# Patient Record
Sex: Female | Born: 1935 | Race: White | Hispanic: No | Marital: Married | Smoking: Never smoker
Health system: Southern US, Community
[De-identification: ages and names within clinical notes are randomized; demographics above are authoritative.]

## PROBLEM LIST (undated history)

## (undated) HISTORY — PX: ABDOMINAL HYSTERECTOMY: SHX81

---

## 2003-02-27 ENCOUNTER — Other Ambulatory Visit: Payer: Self-pay

## 2004-10-11 ENCOUNTER — Ambulatory Visit: Payer: Self-pay | Admitting: Internal Medicine

## 2005-09-29 ENCOUNTER — Ambulatory Visit: Payer: Self-pay | Admitting: Gastroenterology

## 2005-10-20 ENCOUNTER — Ambulatory Visit: Payer: Self-pay | Admitting: Internal Medicine

## 2007-01-16 ENCOUNTER — Ambulatory Visit: Payer: Self-pay | Admitting: Family Medicine

## 2008-01-21 ENCOUNTER — Ambulatory Visit: Payer: Self-pay | Admitting: Internal Medicine

## 2009-01-22 ENCOUNTER — Ambulatory Visit: Payer: Self-pay | Admitting: Internal Medicine

## 2009-02-09 ENCOUNTER — Ambulatory Visit: Payer: Self-pay | Admitting: Internal Medicine

## 2009-03-03 ENCOUNTER — Ambulatory Visit: Payer: Self-pay | Admitting: Surgery

## 2009-03-03 HISTORY — PX: BREAST BIOPSY: SHX20

## 2009-09-10 ENCOUNTER — Ambulatory Visit: Payer: Self-pay | Admitting: Surgery

## 2010-01-25 ENCOUNTER — Ambulatory Visit: Payer: Self-pay | Admitting: Internal Medicine

## 2011-01-28 ENCOUNTER — Ambulatory Visit: Payer: Self-pay | Admitting: Internal Medicine

## 2011-09-27 ENCOUNTER — Ambulatory Visit: Payer: Self-pay | Admitting: Gastroenterology

## 2012-02-01 ENCOUNTER — Ambulatory Visit: Payer: Self-pay | Admitting: Internal Medicine

## 2013-02-05 ENCOUNTER — Ambulatory Visit: Payer: Self-pay | Admitting: Internal Medicine

## 2013-07-11 IMAGING — MG MM DIGITAL SCREENING BILAT W/ CAD
1 series · 4 of 4 positions shown · non-contrast
Comparison: none

REASON FOR EXAM: SCR MAMMO NO ORDER
COMMENTS:

PROCEDURE:     MMM - MMM DGT SCR NO ORDER W/CAD  - February 01, 2012 [DATE]
RESULT:
Comparisons: 01/28/2011, 01/25/2010, 01/22/2009, and 01/21/2008.

[R CC · right · 4 of 4 slices shown]
[im 1/4]
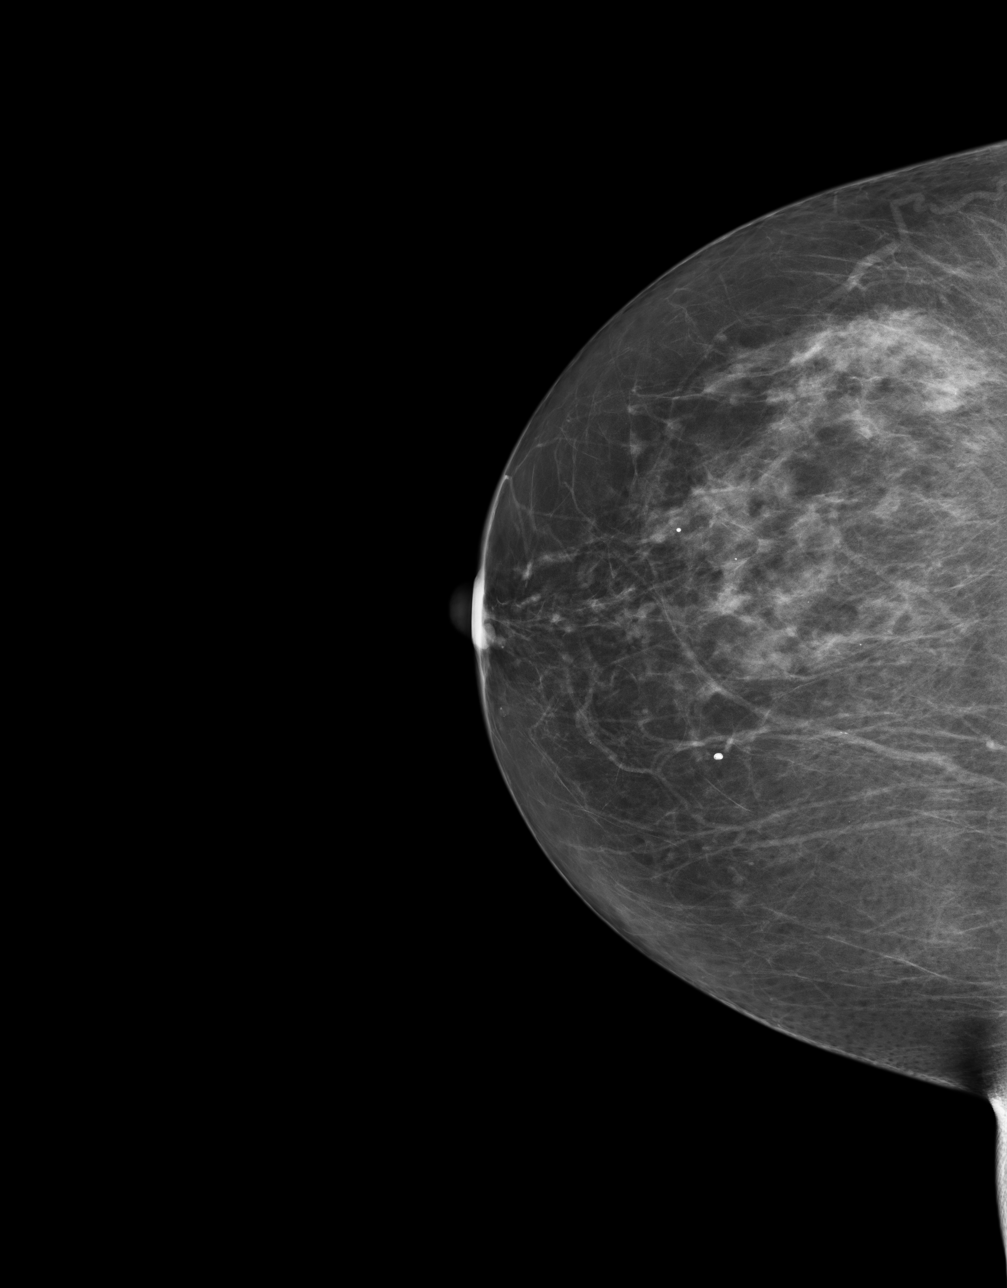
[im 2/4]
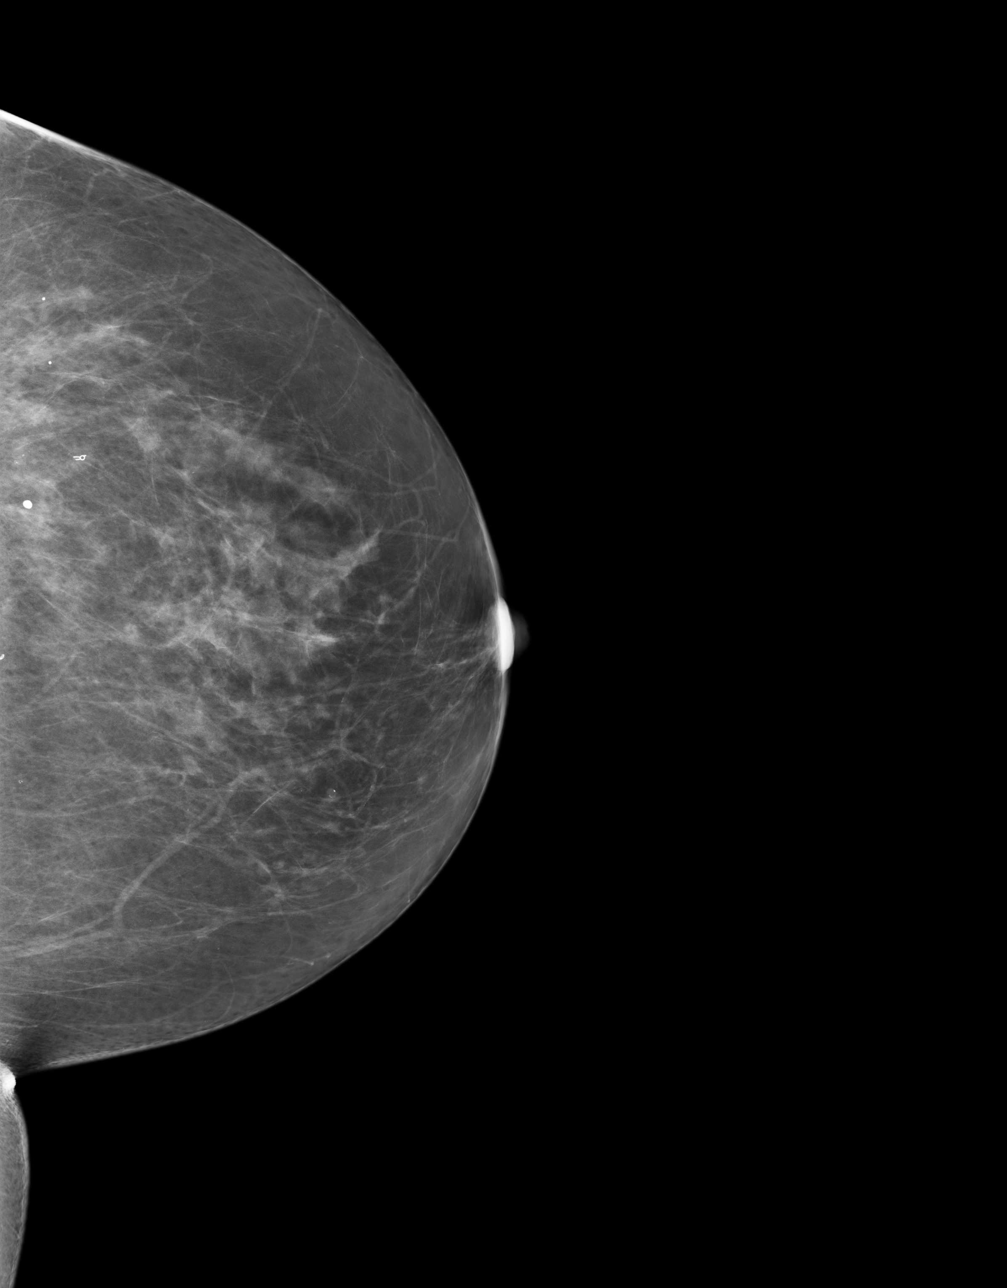
[im 3/4]
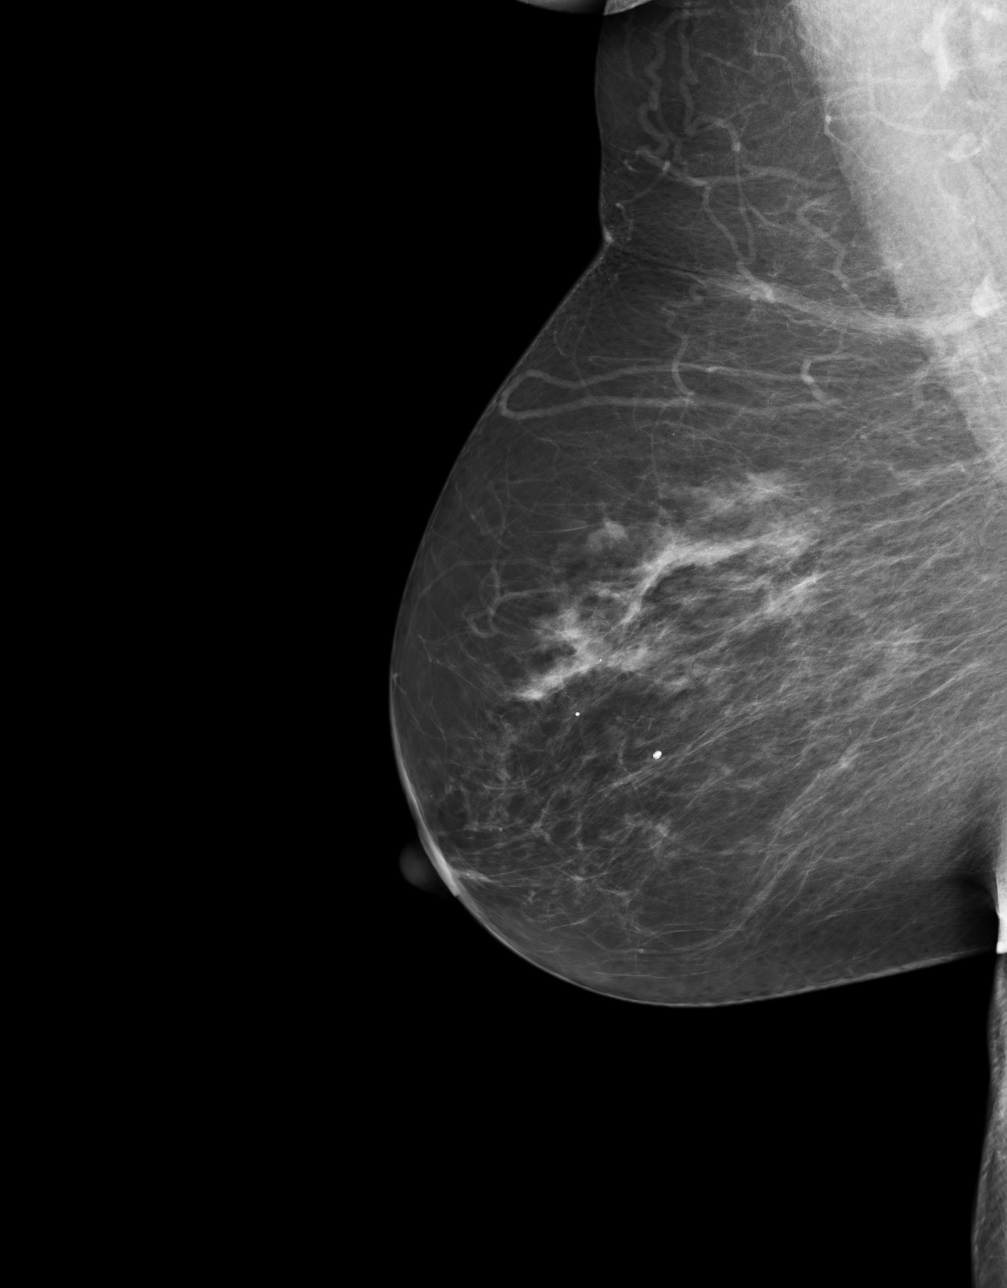
[im 4/4]
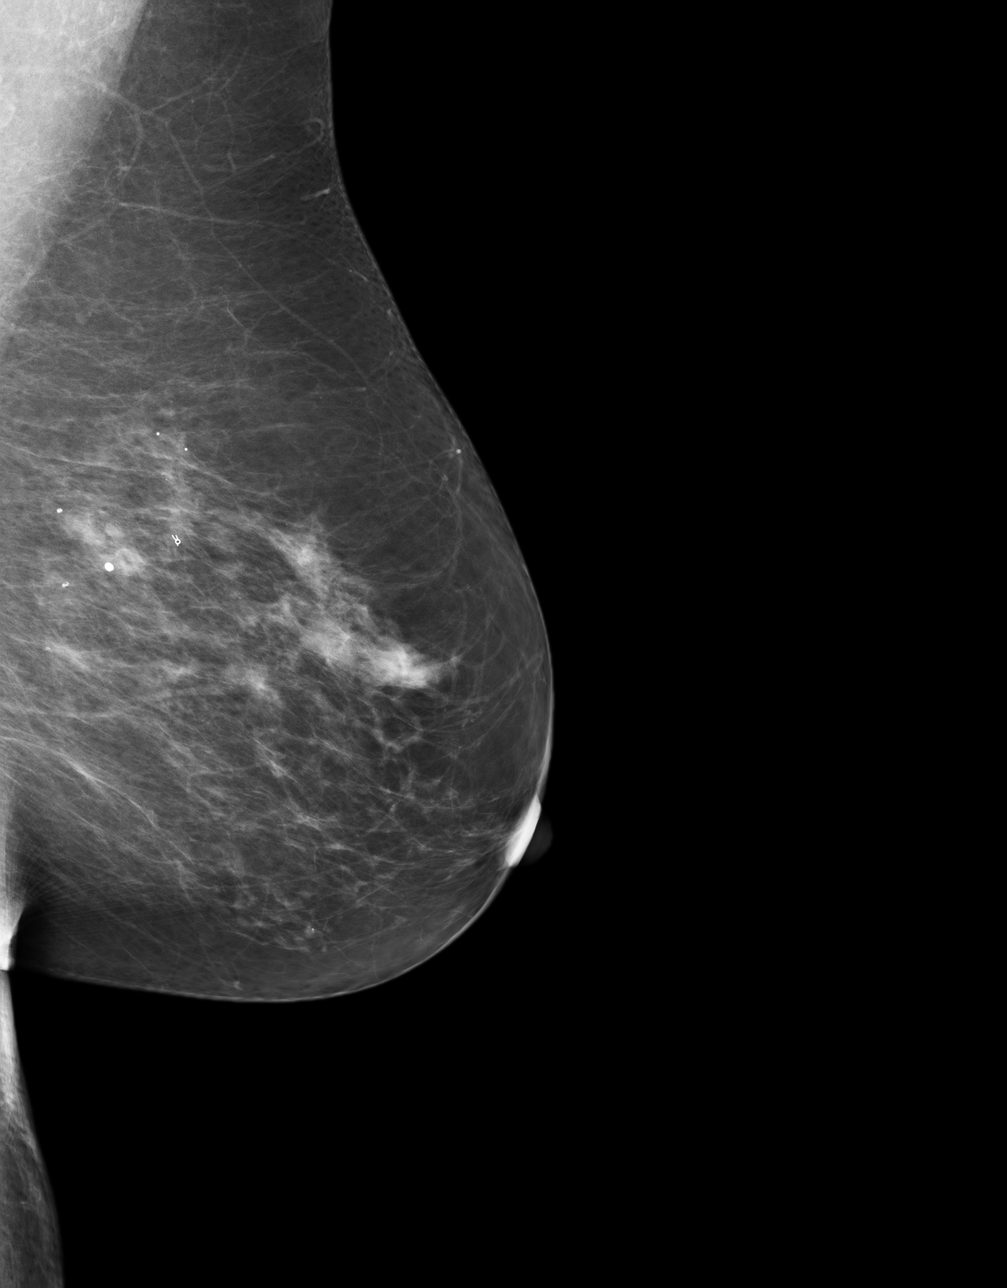

[4 of 4 positions shown; findings below may reference images not displayed]

FINDINGS: There is scattered fibroglandular tissue. No suspicious masses or
calcifications are identified. No areas of architectural distortion.
IMPRESSION: BI-RADS: Category 1 - Negative

Recommend continued annual screening mammography.

A NEGATIVE MAMMOGRAM REPORT DOES NOT PRECLUDE BIOPSY OR OTHER EVALUATION OF
A CLINICALLY PALPABLE OR OTHERWISE SUSPICIOUS MASS OR LESION. BREAST CANCER
MAY NOT BE DETECTED BY MAMMOGRAPHY IN UP TO 10% OF CASES.

## 2015-01-21 ENCOUNTER — Other Ambulatory Visit: Payer: Self-pay | Admitting: Internal Medicine

## 2015-01-21 DIAGNOSIS — Z1231 Encounter for screening mammogram for malignant neoplasm of breast: Secondary | ICD-10-CM

## 2015-01-22 ENCOUNTER — Ambulatory Visit
Admission: RE | Admit: 2015-01-22 | Discharge: 2015-01-22 | Disposition: A | Discharge status: Home / Self Care | Payer: Medicare Other | Source: Ambulatory Visit | Attending: Internal Medicine | Admitting: Internal Medicine

## 2015-01-22 DIAGNOSIS — Z1231 Encounter for screening mammogram for malignant neoplasm of breast: Secondary | ICD-10-CM | POA: Insufficient documentation

## 2017-01-24 ENCOUNTER — Other Ambulatory Visit: Payer: Self-pay | Admitting: Internal Medicine

## 2017-01-24 DIAGNOSIS — Z1231 Encounter for screening mammogram for malignant neoplasm of breast: Secondary | ICD-10-CM

## 2017-02-14 ENCOUNTER — Ambulatory Visit
Admission: RE | Admit: 2017-02-14 | Discharge: 2017-02-14 | Disposition: A | Discharge status: Home / Self Care | Payer: Medicare Other | Source: Ambulatory Visit | Attending: Internal Medicine | Admitting: Internal Medicine

## 2017-02-14 DIAGNOSIS — Z1231 Encounter for screening mammogram for malignant neoplasm of breast: Secondary | ICD-10-CM | POA: Diagnosis not present

## 2019-05-10 ENCOUNTER — Ambulatory Visit: Payer: Medicare Other | Attending: Internal Medicine

## 2019-05-10 DIAGNOSIS — Z23 Encounter for immunization: Secondary | ICD-10-CM | POA: Insufficient documentation

## 2019-05-10 NOTE — Progress Notes (Signed)
   Covid-19 Vaccination Clinic  Name:  Heidi Singh    MRN: [id number] DOB: [birth year]  05/10/2019  [name] was observed post Covid-19 immunization for 15 minutes without incidence. She was provided with Vaccine Information Sheet and instruction to access the V-Safe system.   [name] was instructed to call 911 with any severe reactions post vaccine: Marland Kitchen Difficulty breathing  . Swelling of your face and throat  . A fast heartbeat  . A bad rash all over your body  . Dizziness and weakness    Immunizations Administered    Name Date Dose VIS Date Route   Pfizer COVID-19 Vaccine 05/10/2019 11:06 AM 0.3 mL 02/22/2019 Intramuscular   Manufacturer: [place]   Lot: [id number]   [place]: [id number]

## 2019-06-04 ENCOUNTER — Ambulatory Visit: Payer: Medicare Other | Attending: Internal Medicine

## 2019-06-04 DIAGNOSIS — Z23 Encounter for immunization: Secondary | ICD-10-CM

## 2019-06-04 NOTE — Progress Notes (Signed)
   Covid-19 Vaccination Clinic  Name:  Heidi Singh    MRN: [id number] DOB: 08/17/1935  06/04/2019  [name] was observed post Covid-19 immunization for 15 minutes without incident. She was provided with Vaccine Information Sheet and instruction to access the V-Safe system.   Ms. Will was instructed to call 911 with any severe reactions post vaccine: Marland Kitchen Difficulty breathing  . Swelling of face and throat  . A fast heartbeat  . A bad rash all over body  . Dizziness and weakness   Immunizations Administered    Name Date Dose VIS Date Route   Pfizer COVID-19 Vaccine 06/04/2019  3:33 PM 0.3 mL 02/22/2019 Intramuscular   Manufacturer: [place]   Lot: [id number]   [place]: [id number]

## 2020-08-12 ENCOUNTER — Other Ambulatory Visit: Payer: Self-pay | Admitting: Nephrology

## 2020-08-12 DIAGNOSIS — N184 Chronic kidney disease, stage 4 (severe): Secondary | ICD-10-CM

## 2020-08-12 DIAGNOSIS — D631 Anemia in chronic kidney disease: Secondary | ICD-10-CM

## 2020-08-12 DIAGNOSIS — E1122 Type 2 diabetes mellitus with diabetic chronic kidney disease: Secondary | ICD-10-CM

## 2020-08-12 DIAGNOSIS — R809 Proteinuria, unspecified: Secondary | ICD-10-CM

## 2020-08-12 DIAGNOSIS — N189 Chronic kidney disease, unspecified: Secondary | ICD-10-CM

## 2020-08-12 DIAGNOSIS — R8290 Unspecified abnormal findings in urine: Secondary | ICD-10-CM

## 2020-09-02 ENCOUNTER — Ambulatory Visit
Admission: RE | Admit: 2020-09-02 | Discharge: 2020-09-02 | Disposition: A | Discharge status: Home / Self Care | Payer: Medicare Other | Source: Ambulatory Visit | Attending: Nephrology | Admitting: Nephrology

## 2020-09-02 ENCOUNTER — Other Ambulatory Visit: Payer: Self-pay

## 2020-09-02 DIAGNOSIS — D631 Anemia in chronic kidney disease: Secondary | ICD-10-CM | POA: Diagnosis present

## 2020-09-02 DIAGNOSIS — E1122 Type 2 diabetes mellitus with diabetic chronic kidney disease: Secondary | ICD-10-CM | POA: Diagnosis present

## 2020-09-02 DIAGNOSIS — R809 Proteinuria, unspecified: Secondary | ICD-10-CM

## 2020-09-02 DIAGNOSIS — N184 Chronic kidney disease, stage 4 (severe): Secondary | ICD-10-CM

## 2020-09-02 DIAGNOSIS — N189 Chronic kidney disease, unspecified: Secondary | ICD-10-CM | POA: Diagnosis present

## 2020-09-02 DIAGNOSIS — R8290 Unspecified abnormal findings in urine: Secondary | ICD-10-CM

## 2021-11-20 ENCOUNTER — Ambulatory Visit
Admission: EM | Admit: 2021-11-20 | Discharge: 2021-11-20 | Disposition: A | Discharge status: Home / Self Care | Payer: Medicare Other | Attending: Family Medicine | Admitting: Family Medicine

## 2021-11-20 DIAGNOSIS — B084 Enteroviral vesicular stomatitis with exanthem: Secondary | ICD-10-CM

## 2021-11-20 MED ORDER — HYDROXYZINE HCL 10 MG PO TABS: 25.0000 mg | ORAL_TABLET | Freq: Four times a day (QID) | ORAL | 0 refills | Status: AC

## 2021-11-20 NOTE — ED Triage Notes (Addendum)
Patient reports that her right eye is swollen, red and started breaking out on the right side of her neck and face. Patient reports she is having a reaction to a medication that she was given Thursday by PCP  patient is unsure of the medication that is causing the reaction.   She is currently taking -  - Amoxicillin Clav 875-125mg  - dexamethasone 4mg   - neom/plodex 0.1%

## 2021-11-20 NOTE — ED Provider Notes (Signed)
[facility]    CSN: [id number] Arrival date & time: 11/20/21  8250      History   Chief Complaint No chief complaint on file.   HPI Heidi Singh is a 86 y.o. female.   HPI  Heidi Singh presents for ration worsening right ear pain.  Was seen by her primary care provider yesterday and given medications for a possible infection.  She continues to have left eye drainage and swelling.  She has this itchy rash that showed up.  She notes that it is spreading.  She has not had a fever.  Endorses sore throat and nasal congestion on the right side.  She has some right-sided sinus pressure as well.  There has been no nausea, vomiting, diarrhea, vision changes, chest pain, shortness of breath, dysuria and headache.   History reviewed. No pertinent past medical history.  There are no problems to display for this patient.   Past Surgical History:  Procedure Laterality Date   ABDOMINAL HYSTERECTOMY     BREAST BIOPSY Left 03/03/09   bx/clip-neg    OB History   No obstetric history on file.      Home Medications    Prior to Admission medications   Medication Sig Start Date End Date Taking? Authorizing Provider  hydrochlorothiazide (MICROZIDE) 12.5 MG capsule Take by mouth. 07/07/20  Yes [provider]  hydrOXYzine (ATARAX) 10 MG tablet Take 2.5 tablets (25 mg total) by mouth every 6 (six) hours for 3 days. 11/20/21 11/23/21 Yes Ranie Chinchilla, DO  metoprolol succinate (TOPROL-XL) 50 MG 24 hr tablet Take 1 tablet by mouth daily. 10/14/21  Yes [provider]  ramipril (ALTACE) 10 MG capsule Take by mouth. 07/07/20  Yes [provider]  amoxicillin-clavulanate (AUGMENTIN) 875-125 MG tablet SMARTSIG:1 Tablet(s) By Mouth Every 12 Hours 11/18/21   [provider]  aspirin EC 81 MG tablet Take 81 mg by mouth once daily.    [provider]  dexamethasone (DECADRON) 4 MG tablet Take by mouth. 11/18/21   [provider]  neomycin-polymyxin  b-dexamethasone (MAXITROL) 3.5-10000-0.1 SUSP Place into the right eye. 11/18/21   [provider]  simvastatin (ZOCOR) 20 MG tablet Take 20 mg by mouth at bedtime. 11/05/21   [provider]    Family History Family History  Problem Relation Age of Onset   Breast cancer Neg Hx     Social History Social History   Tobacco Use   Smoking status: Never   Smokeless tobacco: Never  Vaping Use   Vaping Use: Never used  Substance Use Topics   Alcohol use: Not Currently   Drug use: Never     Allergies   Patient has no known allergies.   Review of Systems Review of Systems : negative unless otherwise stated in HPI.      Physical Exam Triage Vital Signs ED Triage Vitals  Enc Vitals Group     BP 11/20/21 0916 139/66     Pulse Rate 11/20/21 0916 65     Resp --      Temp 11/20/21 0916 98.3 F (36.8 C)     Temp Source 11/20/21 0916 Oral     SpO2 11/20/21 0916 97 %     Weight 11/20/21 0911 173 lb (78.5 kg)     Height 11/20/21 0911 5\' 4"  (1.626 m)     Head Circumference --      Peak Flow --      Pain Score 11/20/21 0911 10  Pain Loc --      Pain Edu? --      Excl. in [place]? --    No data found.  Updated Vital Signs BP 139/66 (BP Location: Left Arm)   Pulse 65   Temp 98.3 F (36.8 C) (Oral)   Ht 5\' 4"  (1.626 m)   Wt 78.5 kg   SpO2 97%   BMI 29.70 kg/m   Visual Acuity Right Eye Distance:   Left Eye Distance:   Bilateral Distance:    Right Eye Near:   Left Eye Near:    Bilateral Near:     Physical Exam  GEN: alert, well appearing female, in no acute distress    HENT:  mucus membranes moist, oropharyngeal hard palate ulcerations, right-sided swelling noted, erythematous oropharynx,  nares patent, right TM normal, left TM normal, right maxillary sinus tenderness to palpation EYES:   Wearing glasses, pupils equal and reactive, EOM intact and without pain, bilateral purulent discharge in the canthus NECK:  supple, full normal ROM RESP:  clear to  auscultation bilaterally, no increased work of breathing  CVS:   regular rate and rhythm EXT:   ROM at baseline NEURO:  speech normal, alert and oriented at baseline Skin:   warm and dry, vesicular rash on bilateral hands and feet.  She has a similar rash on her buttocks, posterior neck, left facial maculopapular rash   UC Treatments / Results  Labs (all labs ordered are listed, but only abnormal results are displayed) Labs Reviewed - No data to display  EKG   Radiology No results found.  Procedures Procedures (including critical care time)  Medications Ordered in UC Medications - No data to display  Initial Impression / Assessment and Plan / UC Course  I have reviewed the triage vital signs and the nursing notes.  Pertinent labs & imaging results that were available during my care of the patient were reviewed by me and considered in my medical decision making (see chart for details).    Heidi Singh is a 86 y.o. female who presents for worsening ear pain and rash.  Overall patient is well-appearing, well-hydrated and without respiratory distress.  She is afebrile here without recent use of antipyretics.  Vital signs are stable.  On chart review, she was seen at the [facility] clinic yesterday and was discharged with dexamethasone, antibiotic eyedrops and Augmentin.  Patient has taken Augmentin before and this is only 1 day of rash therefore I doubt this is drug related rash.  She has ulcerative oral lesions, vesicular lesions on her hands and feet.  There are also lesions on her upper back buttock and legs.  Exam is concerning for hand-foot-and-mouth disease.  Her rash and symptoms do not appear to be SJS.  She has some trouble sleeping at night due to the itching.  We discussed hydroxyzine.  Advised her of the potential side effects and she would like this medication. Gave her a short-term supply of hydroxyzine.  Use Tylenol as needed for pain.  She does have purulent drainage from  bilateral eyes.  He may have concomitant bacterial sinusitis.  Advised patient to continue taking her antibiotics and steroids for now.  She is to follow-up with her PCP early next week.  Extensively discussed reasons to stop medications and need for acute intervention.  Husband and patient voiced understanding.   Discussed MDM, treatment plan and plan for follow-up with patient/parent who agrees with plan.     Final Clinical Impressions(s) / UC Diagnoses  Final diagnoses:  Hand, foot and mouth disease (HFMD)     Discharge Instructions      Continue taking your medications as previously prescribed until your follow up with your primary care provider early next week. Your rash appears to be hand, foot and mouth disease that is caused by a virus.   See handout for more information       ED Prescriptions     Medication Sig Dispense Auth. Provider   hydrOXYzine (ATARAX) 10 MG tablet Take 2.5 tablets (25 mg total) by mouth every 6 (six) hours for 3 days. 30 tablet Lyndee Hensen, DO      PDMP not reviewed this encounter.   Lyndee Hensen, DO 11/20/21 1030

## 2021-11-20 NOTE — Discharge Instructions (Addendum)
Continue taking your medications as previously prescribed until your follow up with your primary care provider early next week. Your rash appears to be hand, foot and mouth disease that is caused by a virus.   See handout for more information

## 2022-02-10 IMAGING — US US RENAL
1 series · 14 of 25 positions shown · non-contrast
Comparison: None.

CLINICAL DATA: Chronic kidney disease stage 4.

EXAM:
RENAL / URINARY TRACT ULTRASOUND COMPLETE

[Series 1: us renal · 14 of 45 slices shown]
[im 1/45]
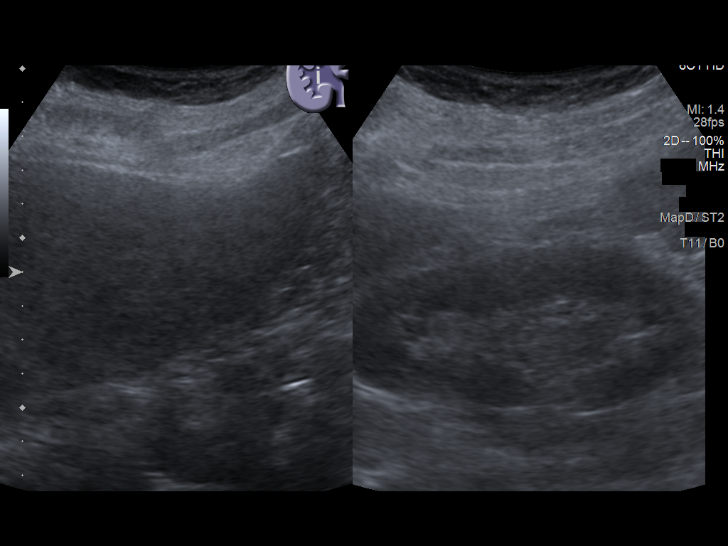
[im 4/45]
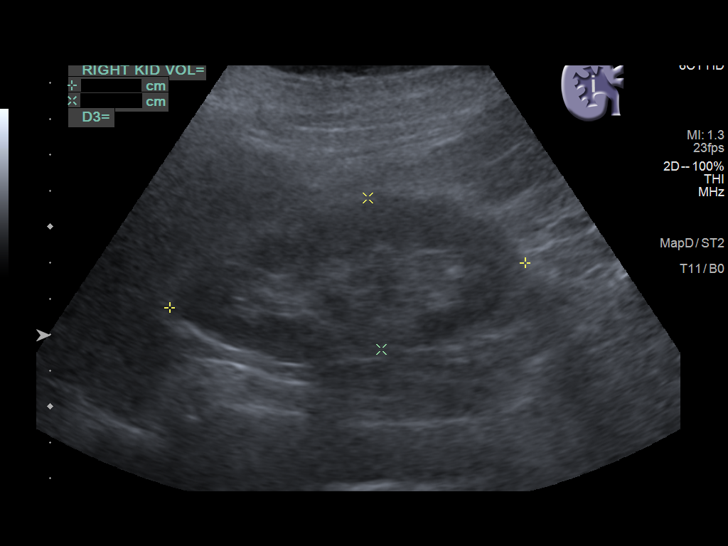
[im 8/45]
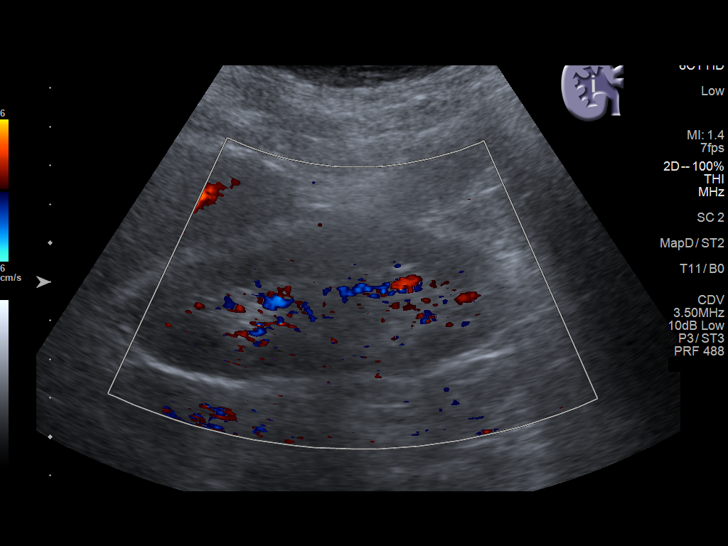
[im 12/45]
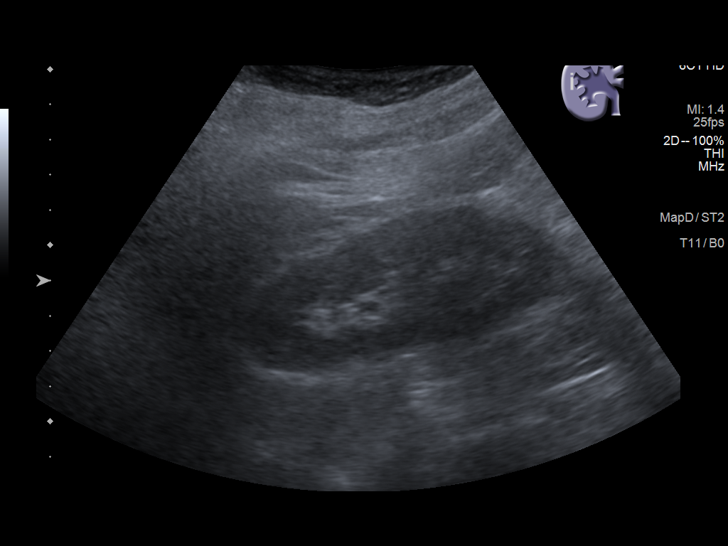
[im 15/45]
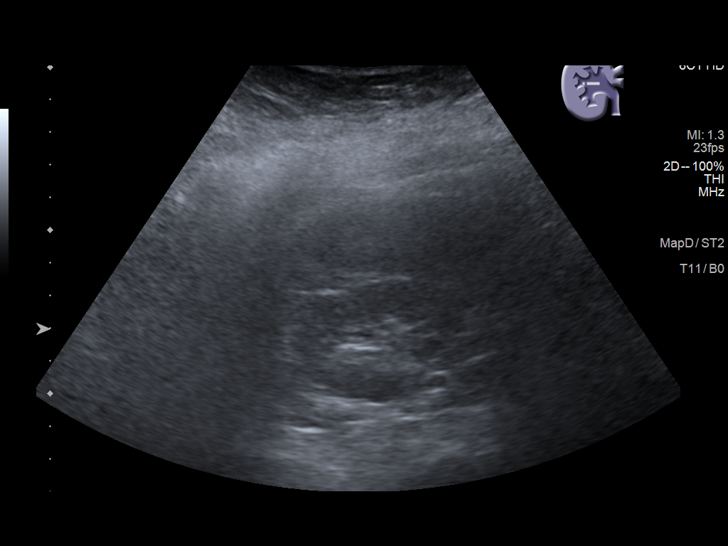
[im 17/45]
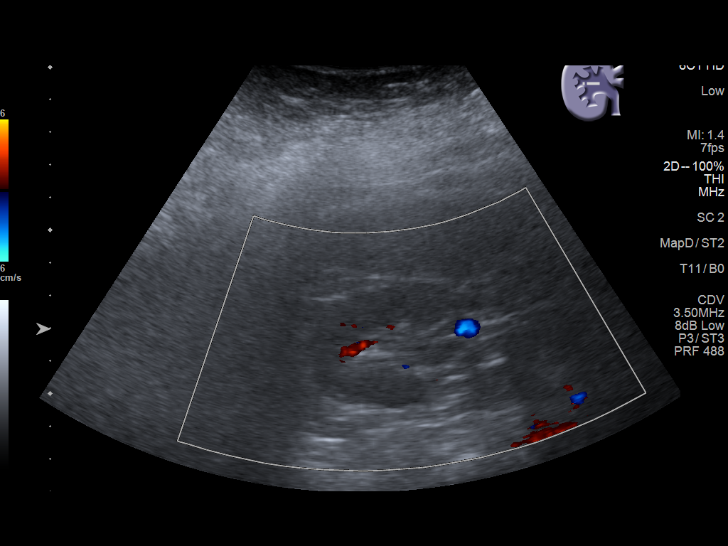
[im 21/45]
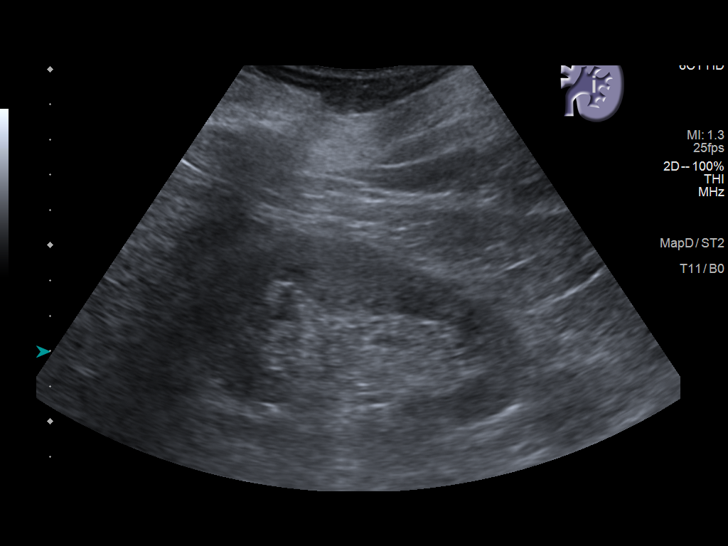
[im 24/45]
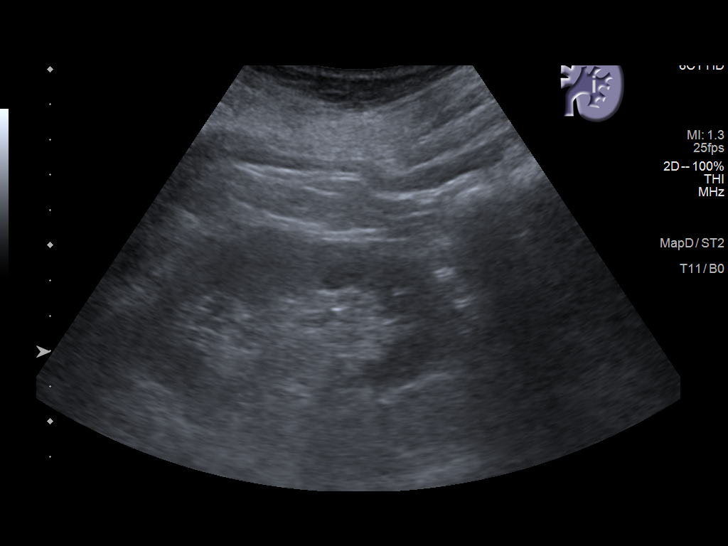
[im 28/45]
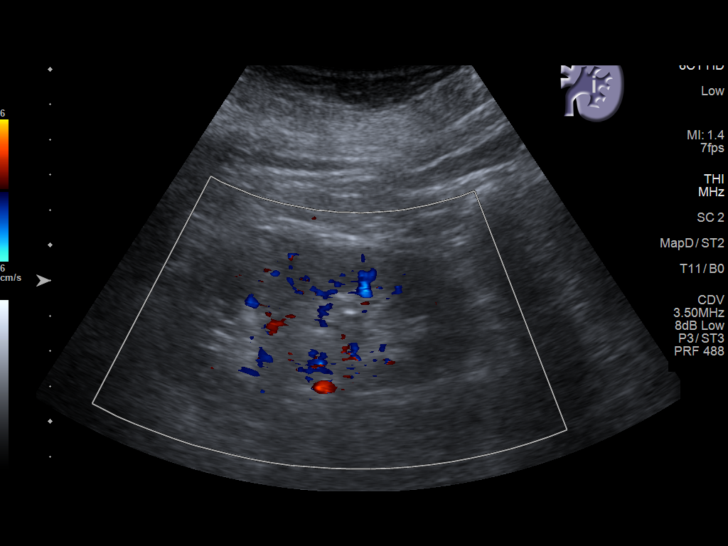
[im 30/45]
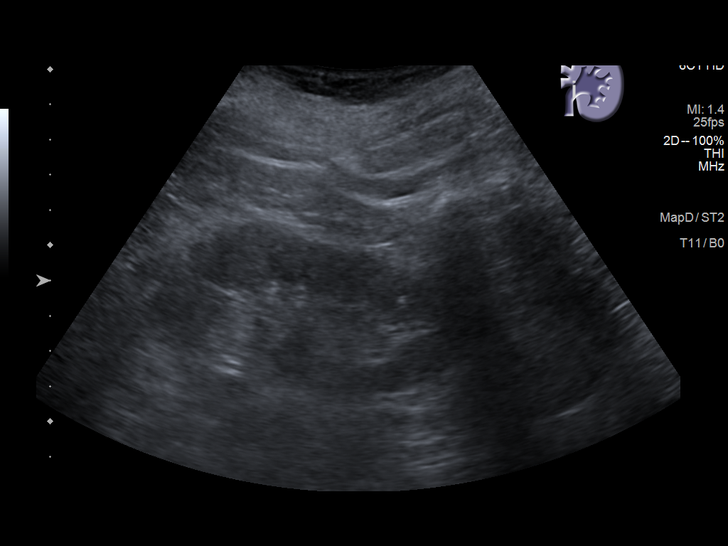
[im 34/45]
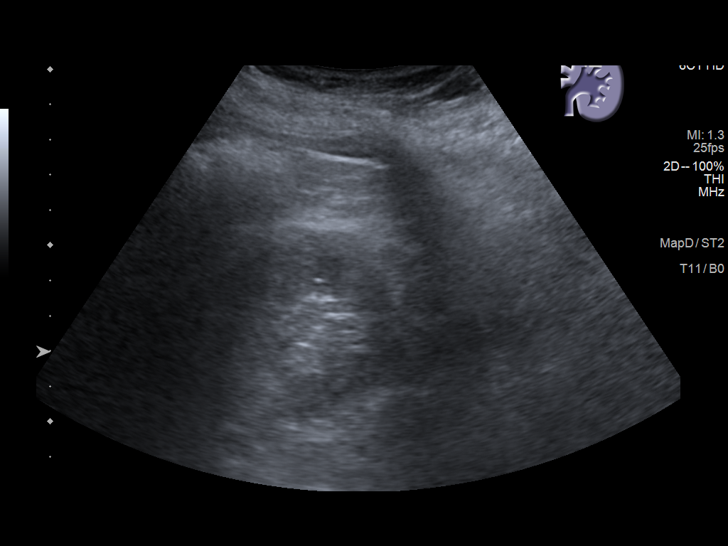
[im 37/45]
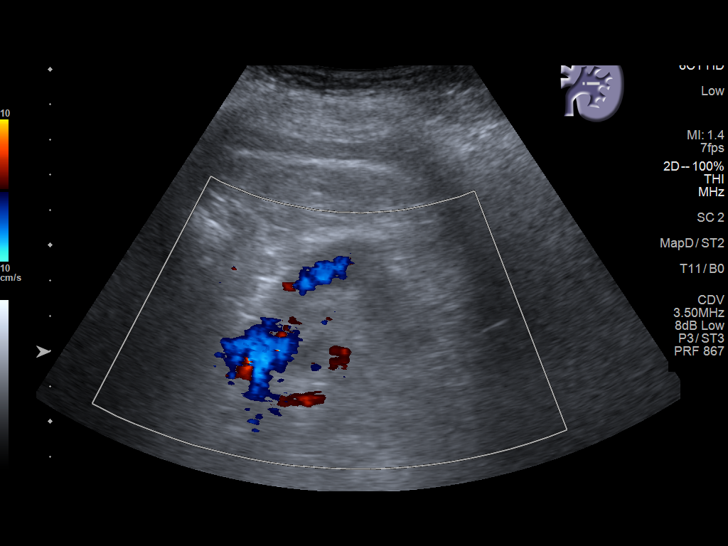
[im 41/45]
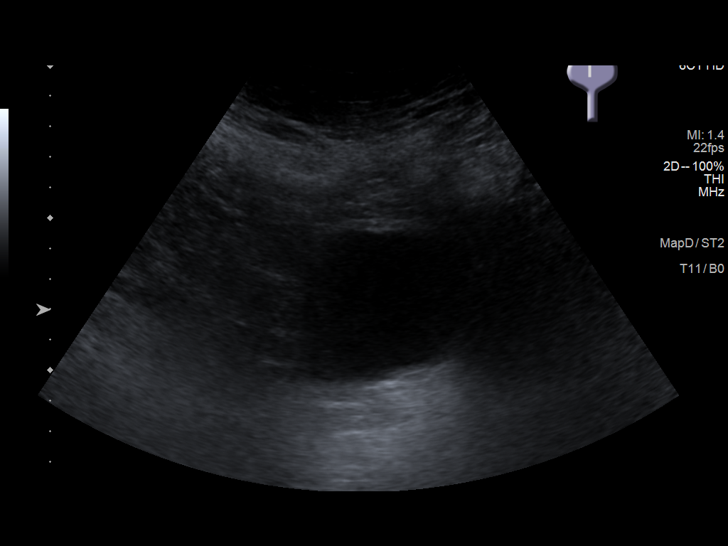
[im 45/45]
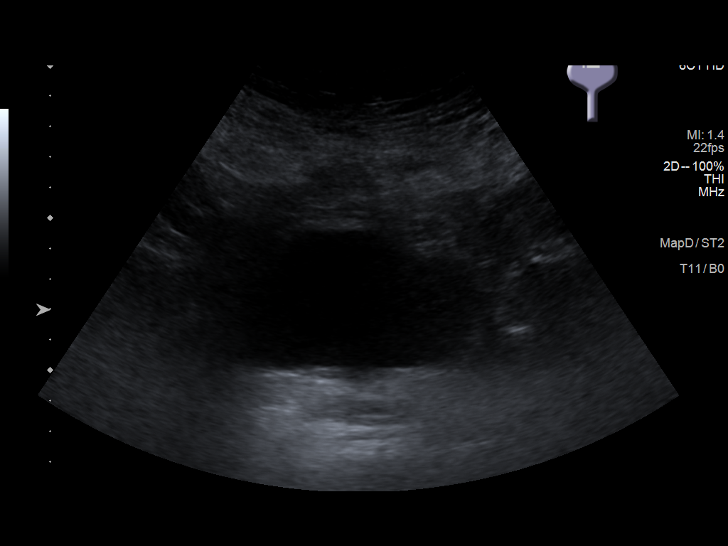

[14 of 25 positions shown; findings below may reference images not displayed]

FINDINGS: Right Kidney:

Renal measurements: 10 x 4.2 x 4.5 cm = volume: 99 mL. Echogenicity
increased. No mass or hydronephrosis visualized.

Left Kidney:

Renal measurements: 9.9 x 4.3 x 3.9 cm = volume: 87 mL. Echogenicity
increased. No mass or hydronephrosis visualized.

Urinary bladder:

Appears normal for degree of bladder distention.

Other:

None.
IMPRESSION: Increased bilateral renal echogenicity suggestive of renal
parenchymal disease.
